# Patient Record
Sex: Female | Born: 2000 | Race: White | Hispanic: No | Marital: Single
Health system: Southern US, Community
[De-identification: ages and names within clinical notes are randomized; demographics above are authoritative.]

## PROBLEM LIST (undated history)

## (undated) DIAGNOSIS — J302 Other seasonal allergic rhinitis: Secondary | ICD-10-CM

## (undated) DIAGNOSIS — E663 Overweight: Secondary | ICD-10-CM

## (undated) DIAGNOSIS — F909 Attention-deficit hyperactivity disorder, unspecified type: Secondary | ICD-10-CM

## (undated) DIAGNOSIS — J45909 Unspecified asthma, uncomplicated: Secondary | ICD-10-CM

## (undated) HISTORY — DX: Overweight: E66.3

---
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
## 2022-05-02 ENCOUNTER — Emergency Department (HOSPITAL_COMMUNITY)
Admission: EM | Admit: 2022-05-02 | Discharge: 2022-05-02 | Disposition: A | Discharge status: Home / Self Care | Payer: Self-pay | Attending: Emergency Medicine | Admitting: Emergency Medicine

## 2022-05-02 ENCOUNTER — Emergency Department (HOSPITAL_COMMUNITY): Payer: Self-pay

## 2022-05-02 ENCOUNTER — Other Ambulatory Visit: Payer: Self-pay

## 2022-05-02 ENCOUNTER — Encounter (HOSPITAL_COMMUNITY): Payer: Self-pay | Admitting: *Deleted

## 2022-05-02 DIAGNOSIS — Z91040 Latex allergy status: Secondary | ICD-10-CM | POA: Insufficient documentation

## 2022-05-02 DIAGNOSIS — G44319 Acute post-traumatic headache, not intractable: Secondary | ICD-10-CM | POA: Insufficient documentation

## 2022-05-02 DIAGNOSIS — Y92410 Unspecified street and highway as the place of occurrence of the external cause: Secondary | ICD-10-CM | POA: Diagnosis not present

## 2022-05-02 DIAGNOSIS — R519 Headache, unspecified: Secondary | ICD-10-CM | POA: Diagnosis present

## 2022-05-02 MED ORDER — ACETAMINOPHEN 500 MG PO TABS
1000.0000 mg | ORAL_TABLET | Freq: Once | ORAL | Status: AC
  Administered 2022-05-02: 1000 mg via ORAL
  Filled 2022-05-02: qty 2

## 2022-05-02 MED ORDER — KETOROLAC TROMETHAMINE 30 MG/ML IJ SOLN
30.0000 mg | Freq: Once | INTRAMUSCULAR | Status: DC
  Filled 2022-05-02: qty 1

## 2022-05-02 MED ORDER — ONDANSETRON 4 MG PO TBDP
4.0000 mg | ORAL_TABLET | Freq: Once | ORAL | Status: AC
  Administered 2022-05-02: 4 mg via ORAL
  Filled 2022-05-02: qty 1

## 2022-05-02 MED ORDER — KETOROLAC TROMETHAMINE 30 MG/ML IJ SOLN
30.0000 mg | Freq: Once | INTRAMUSCULAR | Status: AC
  Administered 2022-05-02: 30 mg via INTRAMUSCULAR
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
## 2022-05-02 NOTE — ED Notes (Signed)
Pt d/c home per MD order. Discharge summary reviewed, pt verbalizes understanding. Ambulatory off unit. No s/s of acute distress noted. Discharged home with visitor.

## 2022-05-02 NOTE — ED Notes (Signed)
Patient transported to X-ray 
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
## 2022-05-07 ENCOUNTER — Ambulatory Visit
Admission: EM | Admit: 2022-05-07 | Discharge: 2022-05-07 | Disposition: A | Discharge status: Home / Self Care | Payer: Self-pay | Attending: Nurse Practitioner | Admitting: Nurse Practitioner

## 2022-05-07 DIAGNOSIS — J069 Acute upper respiratory infection, unspecified: Secondary | ICD-10-CM | POA: Insufficient documentation

## 2022-05-07 DIAGNOSIS — Z20822 Contact with and (suspected) exposure to covid-19: Secondary | ICD-10-CM | POA: Insufficient documentation

## 2022-05-07 DIAGNOSIS — Z8709 Personal history of other diseases of the respiratory system: Secondary | ICD-10-CM | POA: Insufficient documentation

## 2022-05-07 DIAGNOSIS — J4540 Moderate persistent asthma, uncomplicated: Secondary | ICD-10-CM | POA: Insufficient documentation

## 2022-05-07 MED ORDER — BENZONATATE 100 MG PO CAPS: 100.0000 mg | ORAL_CAPSULE | Freq: Three times a day (TID) | ORAL | 0 refills | Status: AC | PRN

## 2022-05-07 MED ORDER — ALBUTEROL SULFATE HFA 108 (90 BASE) MCG/ACT IN AERS: 2.0000 | INHALATION_SPRAY | RESPIRATORY_TRACT | 0 refills | Status: AC | PRN

## 2022-05-07 NOTE — ED Triage Notes (Signed)
Congestion, vomiting, cough, sore throat that stared yesterday. Brother tested positive for Covid yesterday. Taking Nyquil.

## 2022-05-07 NOTE — Discharge Instructions (Addendum)
You have a viral upper respiratory infection.  Symptoms should improve over the next week to 10 days.  If you develop chest pain or shortness of breath, go to the emergency room.  We have tested you today for COVID-19.  You will see the results in Mychart and we will call you with positive results.  Please stay home and isolate until you are aware of the results.    Some things that can make you feel better are: - Increased rest - Increasing fluid with water/sugar free electrolytes - Acetaminophen and ibuprofen as needed for fever/pain - Salt water gargling, chloraseptic spray and throat lozenges for sore throat - OTC guaifenesin (Mucinex) 600 mg twice daily for congestion - Saline sinus flushes or a neti pot for congestion - Humidifying the air -Tessalon Perles every 8 hours as needed during the day as needed for dry cough

## 2022-05-07 NOTE — ED Provider Notes (Signed)
[facility]    CSN: [id number] Arrival date & time: 05/07/22  1350      History   Chief Complaint Chief Complaint  Patient presents with   Nasal Congestion    HPI [name] is a 22 y.o. female.   Patient presents today for 1 day history of hot sweats and cold chills, dry cough, stuffy nose and runny nose, sore throat, diarrhea, decreased appetite, fatigue, and 1 episode of vomiting today.  Patient denies shortness of breath or chest pain, chest congestion, headache, ear pain, abdominal pain, and nausea.  Reports she has been around 2 people who have tested positive for COVID-19.  Has been taking DayQuil and NyQuil for symptoms with minimal benefit.    Past Medical History:  Diagnosis Date   ADHD (attention deficit hyperactivity disorder)    Asthma    Overweight(278.02) 08/08/2012   Seasonal allergies     Patient Active Problem List   Diagnosis Date Noted   Asthma, moderate persistent, poorly-controlled 11/10/2014   Rhinitis, allergic 10/15/2013   ADHD (attention deficit hyperactivity disorder) 08/08/2012    History reviewed. No pertinent surgical history.  OB History   No obstetric history on file.      Home Medications    Prior to Admission medications   Medication Sig Start Date End Date Taking? Authorizing Provider  benzonatate (TESSALON) 100 MG capsule Take 1 capsule (100 mg total) by mouth 3 (three) times daily as needed for cough. Do not take with alcohol or while driving or operating heavy machinery.  May cause drowsiness. 05/07/22  Yes Eulogio Bear, NP  albuterol (PROVENTIL HFA) 108 (90 Base) MCG/ACT inhaler Inhale 2 puffs into the lungs every 4 (four) hours as needed for wheezing or shortness of breath. 05/07/22   Eulogio Bear, NP  Spacer/Aero-Holding Josiah Lobo (AEROCHAMBER W/FLOWSIGNAL) inhaler Use as instructed 02/25/13   Doran Heater, MD    Family History Family History  Problem Relation Age of Onset   Migraines  Father    Asthma Father    Heart disease Father    Diabetes Paternal Grandmother    Diabetes Paternal Grandfather        Age at time of death is unknown    Diabetes Other    ADD / ADHD Cousin        Paternal family Hx first and second cousins   Epilepsy Paternal Uncle     Social History Social History   Tobacco Use   Smoking status: Passive Smoke Exposure - Never Smoker   Smokeless tobacco: Never   Tobacco comments:    Father and step mother smoke   Vaping Use   Vaping Use: Never used  Substance Use Topics   Alcohol use: No    Alcohol/week: 0.0 standard drinks of alcohol   Drug use: No     Allergies   Latex and Other   Review of Systems Review of Systems Per HPI  Physical Exam Triage Vital Signs ED Triage Vitals [05/07/22 1530]  Enc Vitals Group     BP 128/83     Pulse Rate 85     Resp 17     Temp 98.4 F (36.9 C)     Temp Source Oral     SpO2 97 %     Weight      Height      Head Circumference      Peak Flow      Pain Score 0     Pain Loc  Pain Edu?      Excl. in [place]?    No data found.  Updated Vital Signs BP 128/83 (BP Location: Right Arm)   Pulse 85   Temp 98.4 F (36.9 C) (Oral)   Resp 17   LMP 04/23/2022 (Approximate)   SpO2 97%   Visual Acuity Right Eye Distance:   Left Eye Distance:   Bilateral Distance:    Right Eye Near:   Left Eye Near:    Bilateral Near:     Physical Exam Vitals and nursing note reviewed.  Constitutional:      General: She is not in acute distress.    Appearance: Normal appearance. She is obese. She is not ill-appearing or toxic-appearing.  HENT:     Head: Normocephalic and atraumatic.     Right Ear: Tympanic membrane, ear canal and external ear normal.     Left Ear: Tympanic membrane, ear canal and external ear normal.     Nose: Congestion and rhinorrhea present.     Mouth/Throat:     Mouth: Mucous membranes are moist.     Pharynx: Oropharynx is clear. Posterior oropharyngeal erythema present. No  oropharyngeal exudate.  Eyes:     General: No scleral icterus.    Extraocular Movements: Extraocular movements intact.  Cardiovascular:     Rate and Rhythm: Normal rate and regular rhythm.     Heart sounds: Normal heart sounds. No murmur heard. Pulmonary:     Effort: Pulmonary effort is normal. No respiratory distress.     Breath sounds: Normal breath sounds. No wheezing, rhonchi or rales.  Abdominal:     General: Abdomen is flat. Bowel sounds are normal. There is no distension.     Palpations: Abdomen is soft.  Musculoskeletal:     Cervical back: Normal range of motion and neck supple.  Lymphadenopathy:     Cervical: No cervical adenopathy.  Skin:    General: Skin is warm and dry.     Coloration: Skin is not jaundiced or pale.     Findings: No erythema or rash.  Neurological:     Mental Status: She is alert and oriented to person, place, and time.  Psychiatric:        Behavior: Behavior is cooperative.      UC Treatments / Results  Labs (all labs ordered are listed, but only abnormal results are displayed) Labs Reviewed  SARS CORONAVIRUS 2 (TAT 6-24 HRS)    EKG   Radiology No results found.  Procedures Procedures (including critical care time)  Medications Ordered in UC Medications - No data to display  Initial Impression / Assessment and Plan / UC Course  I have reviewed the triage vital signs and the nursing notes.  Pertinent labs & imaging results that were available during my care of the patient were reviewed by me and considered in my medical decision making (see chart for details).   Patient is well-appearing, normotensive, afebrile, not tachycardic, not tachypneic, oxygenating well on room air.    1. History of asthma Refill given for albuterol inhaler to have on hand if she develops shortness of breath or wheezing Examination and vital signs today are reassuring-no wheezing on exam today  2. Exposure to COVID-19 virus 3. Viral URI with  cough Suspect viral etiology COVID-19 testing obtained Supportive care discussed with patient Start cough suppressant ER and return precautions discussed Note given for work  The patient was given the opportunity to ask questions.  All questions answered to their satisfaction.  The  patient is in agreement to this plan.    Final Clinical Impressions(s) / UC Diagnoses   Final diagnoses:  Exposure to COVID-19 virus  Viral URI with cough  History of asthma     Discharge Instructions      You have a viral upper respiratory infection.  Symptoms should improve over the next week to 10 days.  If you develop chest pain or shortness of breath, go to the emergency room.  We have tested you today for COVID-19.  You will see the results in Mychart and we will call you with positive results.  Please stay home and isolate until you are aware of the results.    Some things that can make you feel better are: - Increased rest - Increasing fluid with water/sugar free electrolytes - Acetaminophen and ibuprofen as needed for fever/pain - Salt water gargling, chloraseptic spray and throat lozenges for sore throat - OTC guaifenesin (Mucinex) 600 mg twice daily for congestion - Saline sinus flushes or a neti pot for congestion - Humidifying the air -Tessalon Perles every 8 hours as needed during the day as needed for dry cough     ED Prescriptions     Medication Sig Dispense Auth. Provider   albuterol (PROVENTIL HFA) 108 (90 Base) MCG/ACT inhaler Inhale 2 puffs into the lungs every 4 (four) hours as needed for wheezing or shortness of breath. 18 g Noemi Chapel A, NP   benzonatate (TESSALON) 100 MG capsule Take 1 capsule (100 mg total) by mouth 3 (three) times daily as needed for cough. Do not take with alcohol or while driving or operating heavy machinery.  May cause drowsiness. 21 capsule Eulogio Bear, NP      PDMP not reviewed this encounter.   Eulogio Bear, NP 05/07/22  1549

## 2022-05-08 LAB — SARS CORONAVIRUS 2 (TAT 6-24 HRS): SARS Coronavirus 2: POSITIVE — AB

## 2022-09-12 ENCOUNTER — Emergency Department (HOSPITAL_COMMUNITY)
Admission: EM | Admit: 2022-09-12 | Discharge: 2022-09-12 | Disposition: A | Discharge status: Home / Self Care | Payer: Self-pay | Attending: Emergency Medicine | Admitting: Emergency Medicine

## 2022-09-12 ENCOUNTER — Other Ambulatory Visit: Payer: Self-pay

## 2022-09-12 ENCOUNTER — Encounter (HOSPITAL_COMMUNITY): Payer: Self-pay | Admitting: Emergency Medicine

## 2022-09-12 DIAGNOSIS — W57XXXA Bitten or stung by nonvenomous insect and other nonvenomous arthropods, initial encounter: Secondary | ICD-10-CM | POA: Insufficient documentation

## 2022-09-12 DIAGNOSIS — S40861A Insect bite (nonvenomous) of right upper arm, initial encounter: Secondary | ICD-10-CM | POA: Insufficient documentation

## 2022-09-12 DIAGNOSIS — Z91040 Latex allergy status: Secondary | ICD-10-CM | POA: Insufficient documentation

## 2022-09-12 MED ORDER — DEXAMETHASONE SODIUM PHOSPHATE 4 MG/ML IJ SOLN
4.0000 mg | Freq: Once | INTRAMUSCULAR | Status: AC
  Administered 2022-09-12: 4 mg via INTRAMUSCULAR
  Filled 2022-09-12: qty 1

## 2022-09-12 MED ORDER — DIPHENHYDRAMINE HCL 25 MG PO CAPS
25.0000 mg | ORAL_CAPSULE | Freq: Once | ORAL | Status: AC
  Administered 2022-09-12: 25 mg via ORAL
  Filled 2022-09-12: qty 1

## 2022-09-12 NOTE — ED Triage Notes (Addendum)
Pt c/o bee sting 3 days ago to right upper arm. She denies allergy to bee venom but thinks the stinger may still be in her skin due to persistent redness, swelling, and itching. Right posterior upper arm is reddened and warm to touch from just above the elbow to about halfway to the armpit area.

## 2022-09-12 NOTE — Discharge Instructions (Signed)
You are most likely having a reaction to an insect bite.  You do not show any signs of acute allergic reaction.  Your vitals are stable.  You are given a steroid shot in the ED which will help reduce the inflammation, redness, and itchiness for the next several days.  Additionally you can take over-the-counter antihistamines like Claritin, Zyrtec, or Benadryl for additional relief.  Follow-up with your primary care in 1 to 2 days for reevaluation.  If you do not have a primary care provider there is information on the discharge paperwork with the phone number that you can call to get established with one.  Return to the ED if:  You have joint pain. You have a rash. You feel weak or more tired than you normally do. You have neck pain or a headache. You have signs of an anaphylactic reaction. Signs may include: Swelling of your eyes, lips, face, mouth, tongue, or throat. Feeling warm in the face. Itchy, red, swollen areas of skin. Trouble with breathing, talking, or swallowing. Wheezing. Feeling dizzy or light-headed. Fainting. Pain or cramps in your belly. Vomiting or watery poop.

## 2022-09-12 NOTE — ED Provider Notes (Signed)
[facility] AT [facility] Provider Note   CSN: [id number] Arrival date & time: 09/12/22  1745     History  Chief Complaint  Patient presents with   Insect Bite    [name] is a 22 y.o. female who presents to the ED after an insect bite 3 days ago.  Patient reports redness, swelling, itchiness to the medial aspect of her right upper arm above the elbow. The site is warm to touch. The redness has increased in size since the first day.  Patient has not taken anything for her symptoms. No fever, shortness of breath, swelling of the throat or tongue.  She denies any prior history of localized or allergic reactions to insect bites or stings in the past.  No other complaints or concerns at this time.    Home Medications Prior to Admission medications   Medication Sig Start Date End Date Taking? Authorizing Provider  albuterol (PROVENTIL HFA) 108 (90 Base) MCG/ACT inhaler Inhale 2 puffs into the lungs every 4 (four) hours as needed for wheezing or shortness of breath. 05/07/22   Valentino Nose, NP  benzonatate (TESSALON) 100 MG capsule Take 1 capsule (100 mg total) by mouth 3 (three) times daily as needed for cough. Do not take with alcohol or while driving or operating heavy machinery.  May cause drowsiness. 05/07/22   Valentino Nose, NP  Spacer/Aero-Holding Deretha Emory (AEROCHAMBER W/FLOWSIGNAL) inhaler Use as instructed 02/25/13   Acey Lav, MD      Allergies    Latex and Other    Review of Systems   Review of Systems  Skin:  Positive for rash.       Erythematous and pruritic rash above the medial aspect of olecranon on right arm  All other systems reviewed and are negative.   Physical Exam Updated Vital Signs BP 127/75 (BP Location: Left Arm) Comment (BP Location): Patients bee stings are under right arm  Pulse 75   Temp 99.1 F (37.3 C) (Oral)   Resp 19   Ht 5\' 2"  (1.575 m)   Wt 114.8 kg   LMP 07/24/2022 (Approximate)   SpO2  98%   BMI 46.27 kg/m  Physical Exam Vitals and nursing note reviewed.  Constitutional:      Appearance: Normal appearance.  HENT:     Head: Normocephalic and atraumatic.     Mouth/Throat:     Mouth: Mucous membranes are moist.  Eyes:     Conjunctiva/sclera: Conjunctivae normal.     Pupils: Pupils are equal, round, and reactive to light.  Cardiovascular:     Rate and Rhythm: Normal rate and regular rhythm.     Pulses: Normal pulses.     Heart sounds: Normal heart sounds.  Pulmonary:     Effort: Pulmonary effort is normal.     Breath sounds: Normal breath sounds.  Abdominal:     Palpations: Abdomen is soft.     Tenderness: There is no abdominal tenderness.  Musculoskeletal:     Cervical back: No tenderness.  Lymphadenopathy:     Cervical: No cervical adenopathy.  Skin:    General: Skin is warm and dry.     Findings: Erythema and rash present.     Comments: 8 cm erythematous rash with edema that is warm to touch. No central clearing.  Neurological:     General: No focal deficit present.     Mental Status: She is alert.  Psychiatric:  Mood and Affect: Mood normal.        Behavior: Behavior normal.     ED Results / Procedures / Treatments   Labs (all labs ordered are listed, but only abnormal results are displayed) Labs Reviewed - No data to display  EKG None  Radiology No results found.  Procedures Procedures: not indicated.   Medications Ordered in ED Medications  diphenhydrAMINE (BENADRYL) capsule 25 mg (25 mg Oral Given 09/12/22 2057)  dexamethasone (DECADRON) injection 4 mg (4 mg Intramuscular Given 09/12/22 2104)    ED Course/ Medical Decision Making/ A&P                             Medical Decision Making Risk Prescription drug management.   Patient is a 22 year old female who presents to the ED today for an insect bite to the right medial arm above the antecubital area x 3 days.  My differentials include localized reaction, allergic  reaction, contusion, cellulitis, etc.  On exam, she has a roughly 8 cm circular erythematous rash proximal to her medial epicondyle. It is warm to touch and raised compared to the rest of her surrounding skin. There is no central clearing. Patient is hemodynamically stable. No swelling of the soft palate, tongue, or throat on exam. No cervical LAD.  Patient given Decadron IM in ED as well as Benadryl. Instructed patient to take oral antihistamines at home for continued symptom relief if they persist despite steroid shot. Strict return precautions given.        Final Clinical Impression(s) / ED Diagnoses Final diagnoses:  Insect bite of right upper arm, initial encounter    Rx / DC Orders ED Discharge Orders     None         Maxwell Marion, PA-C 09/12/22 2244    Bethann Berkshire, MD 09/14/22 1149
